# Patient Record
Sex: Male | Born: 2000 | Race: White | Hispanic: No | Marital: Single
Health system: Southern US, Community
[De-identification: ages and names within clinical notes are randomized; demographics above are authoritative.]

---
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
## 2019-08-26 DIAGNOSIS — Z20828 Contact with and (suspected) exposure to other viral communicable diseases: Secondary | ICD-10-CM | POA: Diagnosis not present

## 2021-11-11 DIAGNOSIS — M898X1 Other specified disorders of bone, shoulder: Secondary | ICD-10-CM | POA: Diagnosis not present

## 2021-11-11 DIAGNOSIS — Z0000 Encounter for general adult medical examination without abnormal findings: Secondary | ICD-10-CM | POA: Diagnosis not present

## 2022-02-15 DIAGNOSIS — L700 Acne vulgaris: Secondary | ICD-10-CM | POA: Diagnosis not present

## 2022-09-16 DIAGNOSIS — F4323 Adjustment disorder with mixed anxiety and depressed mood: Secondary | ICD-10-CM | POA: Diagnosis not present

## 2022-09-20 DIAGNOSIS — F4323 Adjustment disorder with mixed anxiety and depressed mood: Secondary | ICD-10-CM | POA: Diagnosis not present

## 2022-09-27 DIAGNOSIS — F4323 Adjustment disorder with mixed anxiety and depressed mood: Secondary | ICD-10-CM | POA: Diagnosis not present

## 2022-10-04 DIAGNOSIS — F4323 Adjustment disorder with mixed anxiety and depressed mood: Secondary | ICD-10-CM | POA: Diagnosis not present

## 2022-10-11 DIAGNOSIS — F4323 Adjustment disorder with mixed anxiety and depressed mood: Secondary | ICD-10-CM | POA: Diagnosis not present

## 2022-10-18 DIAGNOSIS — F4323 Adjustment disorder with mixed anxiety and depressed mood: Secondary | ICD-10-CM | POA: Diagnosis not present

## 2022-10-26 DIAGNOSIS — F4323 Adjustment disorder with mixed anxiety and depressed mood: Secondary | ICD-10-CM | POA: Diagnosis not present

## 2022-11-03 DIAGNOSIS — F4323 Adjustment disorder with mixed anxiety and depressed mood: Secondary | ICD-10-CM | POA: Diagnosis not present

## 2022-11-10 DIAGNOSIS — F4323 Adjustment disorder with mixed anxiety and depressed mood: Secondary | ICD-10-CM | POA: Diagnosis not present

## 2022-11-17 DIAGNOSIS — F4323 Adjustment disorder with mixed anxiety and depressed mood: Secondary | ICD-10-CM | POA: Diagnosis not present

## 2022-12-01 DIAGNOSIS — F4323 Adjustment disorder with mixed anxiety and depressed mood: Secondary | ICD-10-CM | POA: Diagnosis not present

## 2022-12-01 DIAGNOSIS — Z0000 Encounter for general adult medical examination without abnormal findings: Secondary | ICD-10-CM | POA: Diagnosis not present

## 2022-12-08 DIAGNOSIS — Z0000 Encounter for general adult medical examination without abnormal findings: Secondary | ICD-10-CM | POA: Diagnosis not present

## 2022-12-08 DIAGNOSIS — L812 Freckles: Secondary | ICD-10-CM | POA: Diagnosis not present

## 2022-12-08 DIAGNOSIS — R5383 Other fatigue: Secondary | ICD-10-CM | POA: Diagnosis not present

## 2022-12-15 DIAGNOSIS — F4323 Adjustment disorder with mixed anxiety and depressed mood: Secondary | ICD-10-CM | POA: Diagnosis not present

## 2022-12-22 DIAGNOSIS — F4323 Adjustment disorder with mixed anxiety and depressed mood: Secondary | ICD-10-CM | POA: Diagnosis not present

## 2023-01-05 DIAGNOSIS — F4323 Adjustment disorder with mixed anxiety and depressed mood: Secondary | ICD-10-CM | POA: Diagnosis not present

## 2023-02-09 DIAGNOSIS — F4323 Adjustment disorder with mixed anxiety and depressed mood: Secondary | ICD-10-CM | POA: Diagnosis not present

## 2023-03-09 DIAGNOSIS — F4323 Adjustment disorder with mixed anxiety and depressed mood: Secondary | ICD-10-CM | POA: Diagnosis not present

## 2023-04-13 DIAGNOSIS — F4323 Adjustment disorder with mixed anxiety and depressed mood: Secondary | ICD-10-CM | POA: Diagnosis not present

## 2023-05-10 DIAGNOSIS — F4323 Adjustment disorder with mixed anxiety and depressed mood: Secondary | ICD-10-CM | POA: Diagnosis not present

## 2023-06-22 DIAGNOSIS — F4323 Adjustment disorder with mixed anxiety and depressed mood: Secondary | ICD-10-CM | POA: Diagnosis not present

## 2023-09-13 DIAGNOSIS — F4323 Adjustment disorder with mixed anxiety and depressed mood: Secondary | ICD-10-CM | POA: Diagnosis not present

## 2023-12-07 DIAGNOSIS — F4323 Adjustment disorder with mixed anxiety and depressed mood: Secondary | ICD-10-CM | POA: Diagnosis not present

## 2023-12-21 DIAGNOSIS — Z0000 Encounter for general adult medical examination without abnormal findings: Secondary | ICD-10-CM | POA: Diagnosis not present

## 2023-12-28 DIAGNOSIS — R5383 Other fatigue: Secondary | ICD-10-CM | POA: Diagnosis not present

## 2023-12-28 DIAGNOSIS — Z0000 Encounter for general adult medical examination without abnormal findings: Secondary | ICD-10-CM | POA: Diagnosis not present

## 2024-02-22 DIAGNOSIS — F4323 Adjustment disorder with mixed anxiety and depressed mood: Secondary | ICD-10-CM | POA: Diagnosis not present

## 2024-02-23 DIAGNOSIS — F4323 Adjustment disorder with mixed anxiety and depressed mood: Secondary | ICD-10-CM | POA: Diagnosis not present

## 2024-05-30 DIAGNOSIS — F4323 Adjustment disorder with mixed anxiety and depressed mood: Secondary | ICD-10-CM | POA: Diagnosis not present

## 2024-09-09 DIAGNOSIS — R269 Unspecified abnormalities of gait and mobility: Secondary | ICD-10-CM | POA: Diagnosis not present

## 2024-09-09 DIAGNOSIS — M545 Low back pain, unspecified: Secondary | ICD-10-CM | POA: Diagnosis not present

## 2024-10-07 DIAGNOSIS — M545 Low back pain, unspecified: Secondary | ICD-10-CM | POA: Diagnosis not present

## 2024-10-07 DIAGNOSIS — R269 Unspecified abnormalities of gait and mobility: Secondary | ICD-10-CM | POA: Diagnosis not present
